# Patient Record
Sex: Female | Born: 1985 | Race: Black or African American | Hispanic: No | Marital: Married | Smoking: Former smoker
Health system: Southern US, Community
[De-identification: ages and names within clinical notes are randomized; demographics above are authoritative.]

## PROBLEM LIST (undated history)

## (undated) DIAGNOSIS — J189 Pneumonia, unspecified organism: Secondary | ICD-10-CM

## (undated) DIAGNOSIS — E611 Iron deficiency: Secondary | ICD-10-CM

## (undated) DIAGNOSIS — J40 Bronchitis, not specified as acute or chronic: Secondary | ICD-10-CM

## (undated) DIAGNOSIS — N83209 Unspecified ovarian cyst, unspecified side: Secondary | ICD-10-CM

---

## 2015-05-13 ENCOUNTER — Emergency Department: Payer: Managed Care, Other (non HMO)

## 2015-05-13 ENCOUNTER — Encounter: Payer: Self-pay | Admitting: Urgent Care

## 2015-05-13 ENCOUNTER — Emergency Department
Admission: EM | Admit: 2015-05-13 | Discharge: 2015-05-14 | Disposition: A | Discharge status: Home / Self Care | Payer: Managed Care, Other (non HMO) | Attending: Emergency Medicine | Admitting: Emergency Medicine

## 2015-05-13 DIAGNOSIS — Y998 Other external cause status: Secondary | ICD-10-CM | POA: Diagnosis not present

## 2015-05-13 DIAGNOSIS — Y9289 Other specified places as the place of occurrence of the external cause: Secondary | ICD-10-CM | POA: Insufficient documentation

## 2015-05-13 DIAGNOSIS — Y9389 Activity, other specified: Secondary | ICD-10-CM | POA: Insufficient documentation

## 2015-05-13 DIAGNOSIS — R52 Pain, unspecified: Secondary | ICD-10-CM

## 2015-05-13 DIAGNOSIS — S8991XA Unspecified injury of right lower leg, initial encounter: Secondary | ICD-10-CM | POA: Diagnosis present

## 2015-05-13 DIAGNOSIS — X58XXXA Exposure to other specified factors, initial encounter: Secondary | ICD-10-CM | POA: Insufficient documentation

## 2015-05-13 DIAGNOSIS — S73101A Unspecified sprain of right hip, initial encounter: Secondary | ICD-10-CM

## 2015-05-13 DIAGNOSIS — Z87891 Personal history of nicotine dependence: Secondary | ICD-10-CM | POA: Diagnosis not present

## 2015-05-13 NOTE — ED Notes (Signed)
Patient presents with c/o RIGHT thigh pain and RIGHT knee pain that began with acute onset last night. Massage helps, but pain returns. Denies injury.

## 2015-05-13 NOTE — ED Notes (Signed)
Pt ambulatory to room 1 without difficulty or distress noted; pt reports since yesterday having sharp pain to right upper leg from thigh to just above knee; denies hx of same; denies any known injury; +PP, skin W&D, good movem/sensation noted

## 2015-05-13 NOTE — ED Provider Notes (Signed)
[facility]  ____________________________________________  Time seen: Approximately 11:05 PM  I have reviewed the triage vital signs and the nursing notes.   HISTORY  Chief Complaint Leg Pain and Knee Pain   HPI Nancy Woods is a 29 y.o. female complaining of pain to the entire right lateral thigh.  Pt says that it started yesterday, and then got better last eve.  Pt says that this morning she it started again and has been hurting all day.  Pt denies any known injury.  Pt did recently travel to [place] and [place]. Pt denies swelling, numbness or tingling to the leg.  Pt has had no redness or warmth. Pt says that she has been walking today with only minimal pain. Pain is described as a walker on a scale of 0-10.    History reviewed. No pertinent past medical history.  There are no active problems to display for this patient.   History reviewed. No pertinent past surgical history.  Current Outpatient Rx  Name  Route  Sig  Dispense  Refill  . ibuprofen (ADVIL,MOTRIN) 800 MG tablet   Oral   Take 1 tablet (800 mg total) by mouth every 8 (eight) hours as needed.   30 tablet   0   . traMADol (ULTRAM) 50 MG tablet   Oral   Take 1 tablet (50 mg total) by mouth every 6 (six) hours as needed.   20 tablet   0     Allergies Review of patient's allergies indicates no known allergies.  No family history on file.  Social History History  Substance Use Topics  . Smoking status: Former Games developer  . Smokeless tobacco: Not on file  . Alcohol Use: Yes    Review of Systems Constitutional: No fever/chills Eyes: No visual changes. ENT: No sore throat. Cardiovascular: Denies chest pain. Respiratory: Denies shortness of breath chest pain. Gastrointestinal: No abdominal pain.  No nausea, no vomiting.  No diarrhea.  No constipation. Genitourinary: Negative for dysuria, frequency or hematuria. Musculoskeletal: Negative for back pain. Skin: Negative for  rash, redness, or calor. Neurological: Negative for headaches, focal weakness or numbness.  10-point ROS otherwise negative.  ____________________________________________   PHYSICAL EXAM:  VITAL SIGNS: ED Triage Vitals  Enc Vitals Group     BP 05/13/15 2136 105/69 mmHg     Pulse Rate 05/13/15 2136 65     Resp 05/13/15 2136 16     Temp 05/13/15 2136 98.2 F (36.8 C)     Temp Source 05/13/15 2136 Oral     SpO2 05/13/15 2136 100 %     Weight 05/13/15 2136 152 lb (68.947 kg)     Height 05/13/15 2136  (1.626 m)     Head Cir --      Peak Flow --      Pain Score 05/13/15 2137 8     Pain Loc --      Pain Edu? --      Excl. in GC? --     Constitutional: Alert and oriented. Well appearing and in no acute distress. Eyes: Conjunctivae are normal. PERRL. EOMI. Head: Atraumatic. Nose: No congestion/rhinnorhea. Mouth/Throat: Mucous membranes are moist.  Oropharynx non-erythematous. Neck: No stridor.  No cervical spine tenderness or lymphadenopathy Hematological/Lymphatic/Immunilogical: No cervical lymphadenopathy. Cardiovascular: Normal rate, regular rhythm. Grossly normal heart sounds.  Good peripheral circulation. Respiratory: Normal respiratory effort.  No retractions. Lungs CTAB. Gastrointestinal: Soft and nontender. No distention. No abdominal bruits.  Genitourinary: No CVA tenderness Musculoskeletal: Patient has  tenderness to palpation to her right lateral thigh all the way from her hip to her knee. The patient is able to fully range that time with minimal pain. There is no significant swelling noted and she has good pulses to her distal extremity..  No joint effusions. Neurologic:  Normal speech and language. No gross focal neurologic deficits are appreciated. Speech is normal. No gait instability. Skin:  Skin is warm, dry and intact. No rash noted. Psychiatric: Mood and affect are normal. Speech and behavior are normal.  ____________________________________________    LABS (all labs ordered are listed, but only abnormal results are displayed)  Labs Reviewed - No data to display ____________________________________________  EKG  None ____________________________________________  RADIOLOGY US Venous Img Lower Unilateral Right  05/14/2015   CLINICAL DATA:  Acute onset of right upper leg pain for 2 days. Initial encounter.  EXAM: RIGHT LOWER EXTREMITY VENOUS DOPPLER ULTRASOUND  TECHNIQUE: Gray-scale sonography with graded compression, as well as color Doppler and duplex ultrasound were performed to evaluate the lower extremity deep venous systems from the level of the common femoral vein and including the common femoral, femoral, profunda femoral, popliteal and calf veins including the posterior tibial, peroneal and gastrocnemius veins when visible. The superficial great saphenous vein was also interrogated. Spectral Doppler was utilized to evaluate flow at rest and with distal augmentation maneuvers in the common femoral, femoral and popliteal veins.  COMPARISON:  None.  FINDINGS: Contralateral Common Femoral Vein: Respiratory phasicity is normal and symmetric with the symptomatic side. No evidence of thrombus. Normal compressibility.  Common Femoral Vein: No evidence of thrombus. Normal compressibility, respiratory phasicity and response to augmentation.  Saphenofemoral Junction: No evidence of thrombus. Normal compressibility and flow on color Doppler imaging.  Profunda Femoral Vein: No evidence of thrombus. Normal compressibility and flow on color Doppler imaging.  Femoral Vein: No evidence of thrombus. Normal compressibility, respiratory phasicity and response to augmentation.  Popliteal Vein: No evidence of thrombus. Normal compressibility, respiratory phasicity and response to augmentation.  Calf Veins: No evidence of thrombus. Normal compressibility and flow on color Doppler imaging.  Superficial Great Saphenous Vein: No evidence of thrombus. Normal  compressibility and flow on color Doppler imaging.  Venous Reflux:  None.  Other Findings:  None.  IMPRESSION: No evidence of deep venous thrombosis.   Electronically Signed   By: Roanna Raider M.D.   On: 05/14/2015 00:51   Dg Femur, Min 2 Views Right  05/14/2015   CLINICAL DATA:  Acute onset of right thigh and knee pain. Initial encounter.  EXAM: RIGHT FEMUR 2 VIEWS  COMPARISON:  None.  FINDINGS: There is no evidence of fracture or dislocation. The right femur appears intact. The right femoral head remains seated at the acetabulum. The right knee joint is unremarkable. An intrauterine device is partially imaged. No knee joint effusion is identified. No definite soft tissue abnormalities are characterized on radiograph.  IMPRESSION: No evidence of fracture or dislocation.   Electronically Signed   By: Roanna Raider M.D.   On: 05/14/2015 00:33    ____________________________________________   PROCEDURES  Procedure(s) performed: None  Critical Care performed: No  ____________________________________________   INITIAL IMPRESSION / ASSESSMENT AND PLAN / ED COURSE  Pertinent labs & imaging results that were available during my care of the patient were reviewed by me and considered in my medical decision making (see chart for details).  ----------------------------------------- 1:28 AM on 05/14/2015 -----------------------------------------  Patient's Doppler of her leg as well as her expiratory burping or were  negative. Patient is going to be placed on tramadol and ibuprofen. Patient was told that she probably somehow sprained  her leg and just did not realize it. Patient will follow up with her PMD as needed as an outpatient. ____________________________________________   FINAL CLINICAL IMPRESSION(S) / ED DIAGNOSES  Final diagnoses:  Pain  Thigh sprain, right, initial encounter      Leona Carry, MD 05/14/15 [phone]

## 2015-05-14 MED ORDER — IBUPROFEN 800 MG PO TABS
ORAL_TABLET | ORAL | Status: AC
  Administered 2015-05-14: 800 mg via ORAL
  Filled 2015-05-14: qty 1

## 2015-05-14 MED ORDER — IBUPROFEN 800 MG PO TABS
800.0000 mg | ORAL_TABLET | Freq: Once | ORAL | Status: AC
  Administered 2015-05-14: 800 mg via ORAL

## 2015-05-14 MED ORDER — IBUPROFEN 800 MG PO TABS: 800.0000 mg | ORAL_TABLET | Freq: Three times a day (TID) | ORAL | Status: AC | PRN

## 2015-05-14 MED ORDER — TRAMADOL HCL 50 MG PO TABS
50.0000 mg | ORAL_TABLET | Freq: Once | ORAL | Status: AC
  Administered 2015-05-14: 50 mg via ORAL

## 2015-05-14 MED ORDER — TRAMADOL HCL 50 MG PO TABS
ORAL_TABLET | ORAL | Status: AC
  Administered 2015-05-14: 50 mg via ORAL
  Filled 2015-05-14: qty 1

## 2015-05-14 MED ORDER — TRAMADOL HCL 50 MG PO TABS: 50.0000 mg | ORAL_TABLET | Freq: Four times a day (QID) | ORAL | Status: AC | PRN

## 2015-08-07 ENCOUNTER — Ambulatory Visit
Admission: EM | Admit: 2015-08-07 | Discharge: 2015-08-07 | Discharge status: Absent without leave | Payer: Managed Care, Other (non HMO)

## 2015-12-27 ENCOUNTER — Emergency Department
Admission: EM | Admit: 2015-12-27 | Discharge: 2015-12-27 | Disposition: A | Discharge status: Home / Self Care | Payer: Managed Care, Other (non HMO) | Attending: Emergency Medicine | Admitting: Emergency Medicine

## 2015-12-27 ENCOUNTER — Encounter: Payer: Self-pay | Admitting: *Deleted

## 2015-12-27 ENCOUNTER — Emergency Department: Payer: Managed Care, Other (non HMO)

## 2015-12-27 DIAGNOSIS — Z3202 Encounter for pregnancy test, result negative: Secondary | ICD-10-CM | POA: Insufficient documentation

## 2015-12-27 DIAGNOSIS — R109 Unspecified abdominal pain: Secondary | ICD-10-CM

## 2015-12-27 DIAGNOSIS — R197 Diarrhea, unspecified: Secondary | ICD-10-CM | POA: Diagnosis not present

## 2015-12-27 DIAGNOSIS — R110 Nausea: Secondary | ICD-10-CM | POA: Insufficient documentation

## 2015-12-27 DIAGNOSIS — R1013 Epigastric pain: Secondary | ICD-10-CM | POA: Diagnosis not present

## 2015-12-27 DIAGNOSIS — Z87891 Personal history of nicotine dependence: Secondary | ICD-10-CM | POA: Insufficient documentation

## 2015-12-27 DIAGNOSIS — R1031 Right lower quadrant pain: Secondary | ICD-10-CM | POA: Insufficient documentation

## 2015-12-27 DIAGNOSIS — R1011 Right upper quadrant pain: Secondary | ICD-10-CM | POA: Insufficient documentation

## 2015-12-27 HISTORY — DX: Pneumonia, unspecified organism: J18.9

## 2015-12-27 HISTORY — DX: Bronchitis, not specified as acute or chronic: J40

## 2015-12-27 LAB — COMPREHENSIVE METABOLIC PANEL
ALT: 19 U/L (ref 14–54)
ANION GAP: 8 (ref 5–15)
AST: 38 U/L (ref 15–41)
Albumin: 3.9 g/dL (ref 3.5–5.0)
Alkaline Phosphatase: 52 U/L (ref 38–126)
BUN: 10 mg/dL (ref 6–20)
CHLORIDE: 102 mmol/L (ref 101–111)
CO2: 26 mmol/L (ref 22–32)
Calcium: 9.2 mg/dL (ref 8.9–10.3)
Creatinine, Ser: 0.48 mg/dL (ref 0.44–1.00)
Glucose, Bld: 80 mg/dL (ref 65–99)
Potassium: 4 mmol/L (ref 3.5–5.1)
Sodium: 136 mmol/L (ref 135–145)
TOTAL PROTEIN: 7.2 g/dL (ref 6.5–8.1)
Total Bilirubin: 0.2 mg/dL — ABNORMAL LOW (ref 0.3–1.2)

## 2015-12-27 LAB — POCT PREGNANCY, URINE: Preg Test, Ur: NEGATIVE

## 2015-12-27 LAB — CBC
HEMATOCRIT: 39.5 % (ref 35.0–47.0)
Hemoglobin: 13.2 g/dL (ref 12.0–16.0)
MCH: 30.4 pg (ref 26.0–34.0)
MCHC: 33.4 g/dL (ref 32.0–36.0)
MCV: 91.1 fL (ref 80.0–100.0)
PLATELETS: 233 10*3/uL (ref 150–440)
RBC: 4.33 MIL/uL (ref 3.80–5.20)
RDW: 13.5 % (ref 11.5–14.5)
WBC: 10.6 10*3/uL (ref 3.6–11.0)

## 2015-12-27 LAB — URINALYSIS COMPLETE WITH MICROSCOPIC (ARMC ONLY)
Bacteria, UA: NONE SEEN
Bilirubin Urine: NEGATIVE
GLUCOSE, UA: NEGATIVE mg/dL
Hgb urine dipstick: NEGATIVE
Ketones, ur: NEGATIVE mg/dL
Leukocytes, UA: NEGATIVE
NITRITE: NEGATIVE
Protein, ur: NEGATIVE mg/dL
SPECIFIC GRAVITY, URINE: 1.019 (ref 1.005–1.030)
pH: 7 (ref 5.0–8.0)

## 2015-12-27 LAB — LIPASE, BLOOD: LIPASE: 24 U/L (ref 11–51)

## 2015-12-27 MED ORDER — IOHEXOL 300 MG/ML  SOLN
100.0000 mL | Freq: Once | INTRAMUSCULAR | Status: AC | PRN
  Administered 2015-12-27: 100 mL via INTRAVENOUS

## 2015-12-27 MED ORDER — METOCLOPRAMIDE HCL 10 MG PO TABS: 10.0000 mg | ORAL_TABLET | Freq: Four times a day (QID) | ORAL | Status: AC | PRN

## 2015-12-27 MED ORDER — SODIUM CHLORIDE 0.9 % IV BOLUS (SEPSIS)
1000.0000 mL | Freq: Once | INTRAVENOUS | Status: AC
  Administered 2015-12-27: 1000 mL via INTRAVENOUS

## 2015-12-27 MED ORDER — ONDANSETRON HCL 4 MG/2ML IJ SOLN
4.0000 mg | Freq: Once | INTRAMUSCULAR | Status: AC
  Administered 2015-12-27: 4 mg via INTRAVENOUS

## 2015-12-27 MED ORDER — IOHEXOL 240 MG/ML SOLN
25.0000 mL | Freq: Once | INTRAMUSCULAR | Status: AC | PRN
  Administered 2015-12-27: 25 mL via ORAL

## 2015-12-27 NOTE — Discharge Instructions (Signed)
Take reglan as needed for nausea.   Stay hydrated.   Take tylenol, motrin for pain.   Follow up with your doctor.   Return to ER if you have worse abdominal pain, vomiting, fevers.

## 2015-12-27 NOTE — ED Notes (Signed)
Patient transported to CT 

## 2015-12-27 NOTE — ED Notes (Signed)
Pt finished  With contrast, texting on phone in no acute distress. Pt states "i feel better".

## 2015-12-27 NOTE — ED Provider Notes (Signed)
CSN: [id number]     Arrival date & time 12/27/15  1918 History   First MD Initiated Contact with Patient 12/27/15 1948     Chief Complaint  Patient presents with  . Abdominal Pain     (Consider location/radiation/quality/duration/timing/severity/associated sxs/prior Treatment) The history is provided by the patient.  Nayanna Seaborn is a 30 y.o. female hx of pneumonia here with RLQ pain, epigastric pain, nausea. Started with nausea 2 days ago and had an episode of diarrhea 2 days ago. She then had worsening epigastric pain and nausea but no diarrhea. She also has sinus congestion and runny nose and headaches but denies neck pain or fevers. She has an IUD and had some spotting since yesterday. Denies urinary symptoms. States that her pain is now more in the RLQ.    Past Medical History  Diagnosis Date  . Pneumonia   . Bronchitis    History reviewed. No pertinent past surgical history. History reviewed. No pertinent family history. Social History  Substance Use Topics  . Smoking status: Former Smoker    Types: Cigarettes  . Smokeless tobacco: None  . Alcohol Use: Yes     Comment: occasionally   OB History    No data available     Review of Systems  Gastrointestinal: Positive for nausea, abdominal pain and diarrhea.  All other systems reviewed and are negative.     Allergies  Review of patient's allergies indicates no known allergies.  Home Medications   Prior to Admission medications   Medication Sig Start Date End Date Taking? Authorizing Provider  albuterol (VENTOLIN HFA) 108 (90 Base) MCG/ACT inhaler Inhale 2 puffs into the lungs every 6 (six) hours as needed. For wheezing. 10/28/14  Yes Historical Provider, MD  ibuprofen (ADVIL,MOTRIN) 800 MG tablet Take 1 tablet (800 mg total) by mouth every 8 (eight) hours as needed. 05/14/15  Yes Leona Carry, MD  traMADol (ULTRAM) 50 MG tablet Take 50 mg by mouth every 6 (six) hours as needed. For pain. 05/14/15 05/13/16 Yes  Historical Provider, MD  traMADol (ULTRAM) 50 MG tablet Take 1 tablet (50 mg total) by mouth every 6 (six) hours as needed. 05/14/15 05/13/16  Leona Carry, MD   BP 113/80 mmHg  Pulse 87  Temp(Src) 97.5 F (36.4 C) (Oral)  Resp 20  Ht  (1.626 m)  Wt 158 lb (71.668 kg)  BMI 27.11 kg/m2  SpO2 100% Physical Exam  Constitutional: She is oriented to person, place, and time. She appears well-developed and well-nourished.  HENT:  Head: Normocephalic.  Mouth/Throat: Oropharynx is clear and moist.  Eyes: Conjunctivae are normal. Pupils are equal, round, and reactive to light.  Neck: Normal range of motion. Neck supple.  Cardiovascular: Normal rate, regular rhythm and normal heart sounds.   Pulmonary/Chest: Effort normal and breath sounds normal. No respiratory distress. She has no wheezes. She has no rales.  Abdominal: Soft. Bowel sounds are normal.  Mild diffuse tenderness, worse in RLQ and RUQ, no obvious murphy's.   Musculoskeletal: Normal range of motion.  Neurological: She is alert and oriented to person, place, and time.  Skin: Skin is warm and dry.  Psychiatric: She has a normal mood and affect. Her behavior is normal. Judgment and thought content normal.  Nursing note and vitals reviewed.   ED Course  Procedures (including critical care time) Labs Review Labs Reviewed  COMPREHENSIVE METABOLIC PANEL - Abnormal; Notable for the following:    Total Bilirubin 0.2 (*)    All other  components within normal limits  URINALYSIS COMPLETEWITH MICROSCOPIC ([facility] ONLY) - Abnormal; Notable for the following:    Color, Urine YELLOW (*)    APPearance CLEAR (*)    Squamous Epithelial / LPF 0-5 (*)    All other components within normal limits  LIPASE, BLOOD  CBC  POC URINE PREG, ED  POCT PREGNANCY, URINE    Imaging Review Ct Abdomen Pelvis W Contrast  12/27/2015  CLINICAL DATA:  Acute onset of sore throat, nausea and lower abdominal pain. Initial encounter. EXAM: CT ABDOMEN AND  PELVIS WITH CONTRAST TECHNIQUE: Multidetector CT imaging of the abdomen and pelvis was performed using the standard protocol following bolus administration of intravenous contrast. CONTRAST:  OMNIPAQUE IOHEXOL 300 MG/ML  SOLN COMPARISON:  None. FINDINGS: The visualized lung bases are clear. The liver and spleen are unremarkable in appearance. The gallbladder is within normal limits. The pancreas and adrenal glands are unremarkable. The kidneys are unremarkable in appearance. There is no evidence of hydronephrosis. No renal or ureteral stones are seen. No perinephric stranding is appreciated. The small bowel is unremarkable in appearance. The stomach is within normal limits. No acute vascular abnormalities are seen. The appendix is normal in caliber, without evidence of appendicitis. Contrast progresses to the level of the transverse colon. The colon is unremarkable in appearance. The bladder is mildly distended and grossly unremarkable. The uterus is grossly unremarkable. An intrauterine device is noted in expected position at the fundus of the uterus. Trace free fluid in the pelvis is likely physiologic in nature. The ovaries are grossly unremarkable in appearance. No suspicious adnexal masses are seen. No inguinal lymphadenopathy is seen. No acute osseous abnormalities are identified. IMPRESSION: Unremarkable contrast-enhanced CT of the abdomen and pelvis. Electronically Signed   By: Roanna Raider M.D.   On: 12/27/2015 22:39   I have personally reviewed and evaluated these images and lab results as part of my medical decision-making.   EKG Interpretation None      MDM   Final diagnoses:  None    Amzie Sillas is a 30 y.o. female here with abdominal pain. Likely gastro vs appy vs chole. Will get labs, UA, CT ab/pel. Will hydrate and reassess.   10:44 PM CT unremarkable. Labs and UA nl. Likely mild gastro. Will dc home with prn reglan.     Richardean Canal, MD 12/27/15 2245

## 2015-12-27 NOTE — ED Notes (Signed)
Pt c/o lower abdominal pain. Pt states she has had epigastric pain and nausea for several days, worsening tonight, radiating down R side of abdomen. Pt states now pain is in lower abdomen. Pt c/o nausea, denies vomiting. Pt denies dysuria at this time, c/o straining w/ BM. Pt unsure if pregnant. Pt ambulatory to triage, in no observable acute distress.

## 2016-06-21 ENCOUNTER — Ambulatory Visit
Admission: EM | Admit: 2016-06-21 | Discharge: 2016-06-21 | Disposition: A | Discharge status: Home / Self Care | Payer: Managed Care, Other (non HMO) | Attending: Family Medicine | Admitting: Family Medicine

## 2016-06-21 DIAGNOSIS — S161XXA Strain of muscle, fascia and tendon at neck level, initial encounter: Secondary | ICD-10-CM

## 2016-06-21 DIAGNOSIS — M6248 Contracture of muscle, other site: Secondary | ICD-10-CM

## 2016-06-21 DIAGNOSIS — M62838 Other muscle spasm: Secondary | ICD-10-CM

## 2016-06-21 HISTORY — DX: Unspecified ovarian cyst, unspecified side: N83.209

## 2016-06-21 HISTORY — DX: Iron deficiency: E61.1

## 2016-06-21 MED ORDER — TRAMADOL HCL 50 MG PO TABS: 50.0000 mg | ORAL_TABLET | Freq: Two times a day (BID) | ORAL | 0 refills | Status: AC | PRN

## 2016-06-21 MED ORDER — KETOROLAC TROMETHAMINE 60 MG/2ML IM SOLN
60.0000 mg | Freq: Once | INTRAMUSCULAR | Status: AC
  Administered 2016-06-21: 60 mg via INTRAMUSCULAR

## 2016-06-21 MED ORDER — MELOXICAM 15 MG PO TABS: 15.0000 mg | ORAL_TABLET | Freq: Every day | ORAL | 0 refills | Status: AC

## 2016-06-21 MED ORDER — ORPHENADRINE CITRATE ER 100 MG PO TB12: 100.0000 mg | ORAL_TABLET | Freq: Two times a day (BID) | ORAL | 0 refills | Status: AC

## 2016-06-21 NOTE — ED Triage Notes (Signed)
Patient complains of neck and back pain from packing books into a large box last night.

## 2016-06-21 NOTE — ED Provider Notes (Addendum)
[facility]    CSN: [id number] Arrival date & time: 06/21/16  1422  First Provider Contact:  First MD Initiated Contact with Patient 06/21/16 1643        History   Chief Complaint Chief Complaint  Patient presents with  . Neck Pain    HPI Nancy Woods is a 30 y.o. female.   Patient reports lifting some heavy boxes at home and start on some discomfort in neck. She continued to work but then this morning when she woke and marked pain in the right side of her neck. She denies any trauma to her neck directly. States that she some pain pain like this before and that the pain is a 9 out of 10. She denies any direct trauma to her neck. Past medical history she is not allergic to any medication   No previous medical problems other than a history of bronchitis and anemia of the cyst and one-time pneumonia but no chronic or recurrent problems. No pertinent family medical history pertaining to this visit   The history is provided by the patient. No language interpreter was used.  Neck Pain  Pain location:  R side Quality:  Aching Pain radiates to:  R shoulder Pain severity:  Moderate Pain is:  Same all the time Onset quality:  Sudden Timing:  Constant Progression:  Worsening Chronicity:  New Context: lifting a heavy object   Relieved by:  Nothing Worsened by:  Nothing Ineffective treatments:  Heat and NSAIDs Associated symptoms: no bladder incontinence, no bowel incontinence, no chest pain, no headaches, no numbness, no paresis, no visual change and no weakness   Risk factors: no hx of head and neck radiation     Past Medical History:  Diagnosis Date  . Bronchitis   . Iron deficiency   . Ovarian cyst   . Pneumonia     There are no active problems to display for this patient.   History reviewed. No pertinent surgical history.  OB History    No data available       Home Medications    Prior to Admission medications   Medication Sig Start Date End  Date Taking? Authorizing Provider  albuterol (VENTOLIN HFA) 108 (90 Base) MCG/ACT inhaler Inhale 2 puffs into the lungs every 6 (six) hours as needed. For wheezing. 10/28/14   Historical Provider, MD  ibuprofen (ADVIL,MOTRIN) 800 MG tablet Take 1 tablet (800 mg total) by mouth every 8 (eight) hours as needed. 05/14/15   Leona Carry, MD  meloxicam (MOBIC) 15 MG tablet Take 1 tablet (15 mg total) by mouth daily. 06/21/16   Hassan Rowan, MD  metoCLOPramide (REGLAN) 10 MG tablet Take 1 tablet (10 mg total) by mouth every 6 (six) hours as needed for nausea (nausea/headache). 12/27/15   Charlynne Pander, MD  orphenadrine (NORFLEX) 100 MG tablet Take 1 tablet (100 mg total) by mouth 2 (two) times daily. 06/21/16   Hassan Rowan, MD  traMADol (ULTRAM) 50 MG tablet Take 1 tablet (50 mg total) by mouth every 12 (twelve) hours as needed for moderate pain or severe pain. 06/21/16   Hassan Rowan, MD    Family History Family History  Problem Relation Age of Onset  . Diabetes Other   . Hypertension Other     Social History Social History  Substance Use Topics  . Smoking status: Former Smoker    Types: Cigarettes  . Smokeless tobacco: Never Used  . Alcohol use Yes     Comment: occasionally  Allergies   Review of patient's allergies indicates no known allergies.   Review of Systems Review of Systems  Cardiovascular: Negative for chest pain.  Gastrointestinal: Negative for bowel incontinence.  Genitourinary: Negative for bladder incontinence.  Musculoskeletal: Positive for neck pain.  Neurological: Negative for weakness, numbness and headaches.  All other systems reviewed and are negative.    Physical Exam Triage Vital Signs ED Triage Vitals  Enc Vitals Group     BP 06/21/16 1536 115/73     Pulse Rate 06/21/16 1536 65     Resp 06/21/16 1536 18     Temp 06/21/16 1536 97.7 F (36.5 C)     Temp Source 06/21/16 1536 Oral     SpO2 06/21/16 1536 100 %     Weight 06/21/16 1535 160 lb (72.6 kg)       Height 06/21/16 1535 5\' 4"  (1.626 m)     Head Circumference --      Peak Flow --      Pain Score 06/21/16 1536 9     Pain Loc --      Pain Edu? --      Excl. in GC? --    No data found.   Updated Vital Signs BP 115/73 (BP Location: Left Arm)   Pulse 65   Temp 97.7 F (36.5 C) (Oral)   Resp 18   Ht 5\' 4"  (1.626 m)   Wt 160 lb (72.6 kg)   LMP 06/18/2016   SpO2 100%   BMI 27.46 kg/m   Visual Acuity Right Eye Distance:   Left Eye Distance:   Bilateral Distance:    Right Eye Near:   Left Eye Near:    Bilateral Near:     Physical Exam  Constitutional: She is oriented to person, place, and time. She appears well-developed and well-nourished.  HENT:  Head: Normocephalic and atraumatic.  Eyes: Conjunctivae and EOM are normal. Pupils are equal, round, and reactive to light.  Neck: Trachea normal and normal range of motion. Neck supple. Muscular tenderness present. No tracheal deviation present.    Pulmonary/Chest: Effort normal.  Musculoskeletal: She exhibits tenderness.  Neurological: She is alert and oriented to person, place, and time.  Skin: Skin is warm. She is not diaphoretic.  Psychiatric: She has a normal mood and affect.  Vitals reviewed.    UC Treatments / Results  Labs (all labs ordered are listed, but only abnormal results are displayed) Labs Reviewed - No data to display  EKG  EKG Interpretation None       Radiology No results found.  Procedures Procedures (including critical care time)  Medications Ordered in UC Medications  ketorolac (TORADOL) injection 60 mg (60 mg Intramuscular Given 06/21/16 1657)     Initial Impression / Assessment and Plan / UC Course  I have reviewed the triage vital signs and the nursing notes.  Pertinent labs & imaging results that were available during my care of the patient were reviewed by me and considered in my medical decision making (see chart for details).  Clinical Course    Patient will be  given a 60 mg Toradol now for the pain she states is 9 out of 10. Since there was no direct trauma to her neck we will forego x-raying her neck at this time she seemed comfortable with that. We'll give her tramadol to use at night if need be for sleep. Warned tramadol can cause sedation and addiction. We'll place him Mobic 15 mg Norflex 100 mg twice  a day. PCP next week if needed. Offer patient a work note but she declined states is off work this week and next week  Final Clinical Impressions(s) / UC Diagnoses   Final diagnoses:  Cervical strain, acute, initial encounter  Muscle spasms of neck  Trapezius muscle spasm    New Prescriptions New Prescriptions   MELOXICAM (MOBIC) 15 MG TABLET    Take 1 tablet (15 mg total) by mouth daily.   ORPHENADRINE (NORFLEX) 100 MG TABLET    Take 1 tablet (100 mg total) by mouth 2 (two) times daily.   TRAMADOL (ULTRAM) 50 MG TABLET    Take 1 tablet (50 mg total) by mouth every 12 (twelve) hours as needed for moderate pain or severe pain.     Hassan Rowan, MD 06/21/16 1710    Hassan Rowan, MD 06/21/16 [phone]

## 2017-03-11 IMAGING — CT CT ABD-PELV W/ CM
1 of 2 series · 15 of 32 positions shown, 19 images · IV contrast (omnipaque)
Comparison: None.

CLINICAL DATA: Acute onset of sore throat, nausea and lower
abdominal pain. Initial encounter.

EXAM:
CT ABDOMEN AND PELVIS WITH CONTRAST
TECHNIQUE: Multidetector CT imaging of the abdomen and pelvis was performed
using the standard protocol following bolus administration of
intravenous contrast.
CONTRAST:  100mL OMNIPAQUE IOHEXOL 300 MG/ML  SOLN

[Series 2: routine abd pel with · axial · 0.74mm/px · z∈[-438,-42]mm · 15 of 87 slices shown, 19 images]
[im 4/87  soft-tissue]
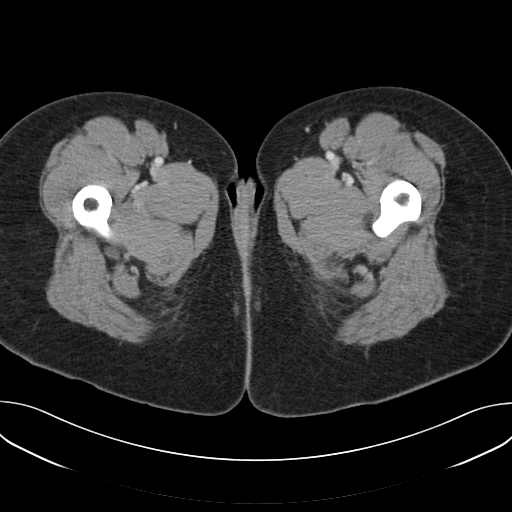
[im 4/87  bone]
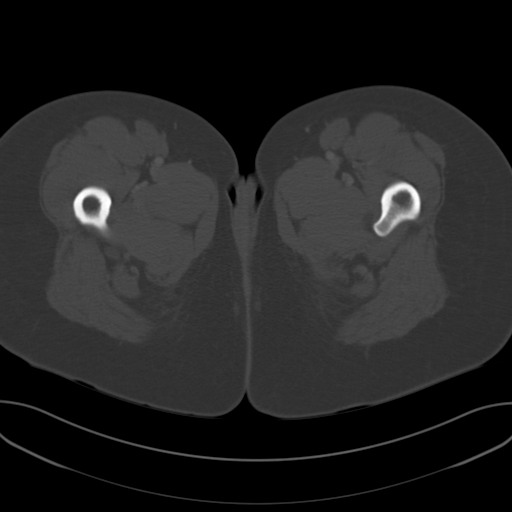
[im 10/87  soft-tissue]
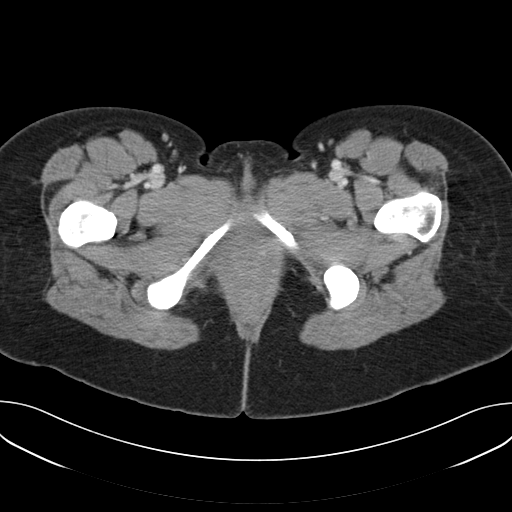
[im 17/87  soft-tissue]
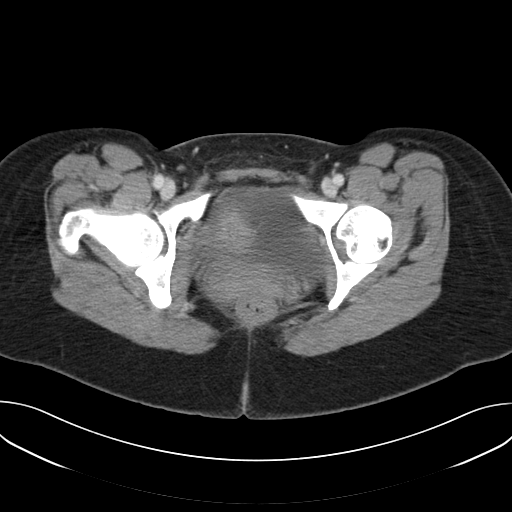
[im 24/87  soft-tissue]
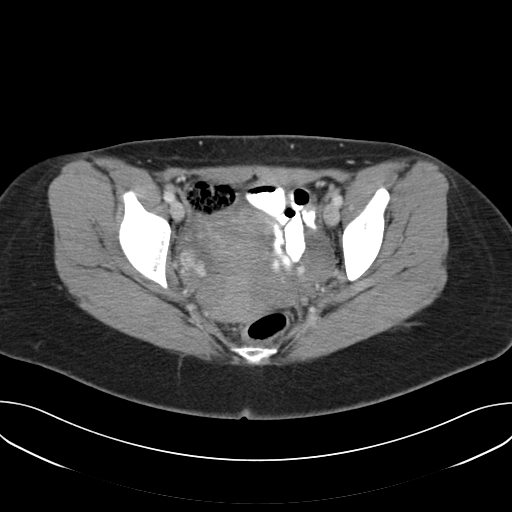
[im 30/87  soft-tissue]
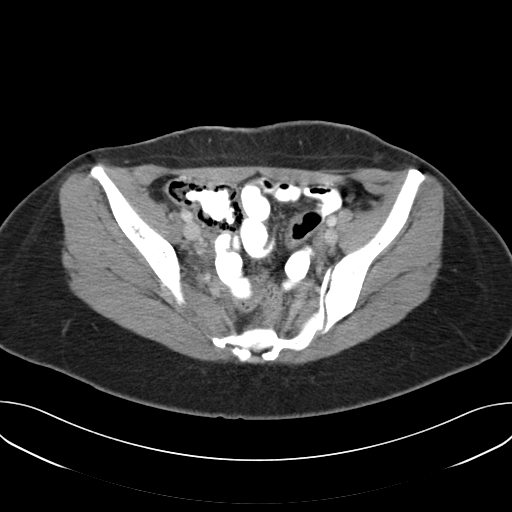
[im 37/87  soft-tissue]
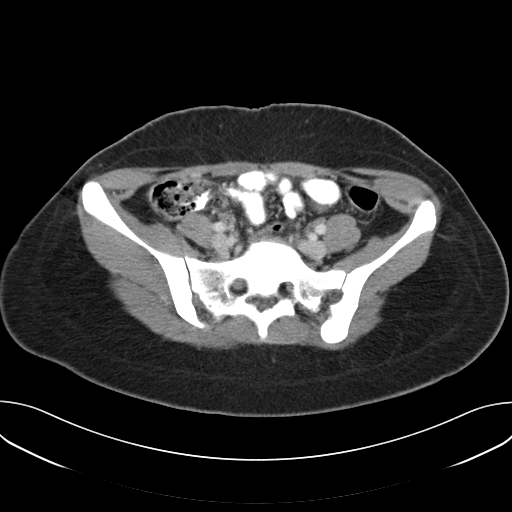
[im 44/87  soft-tissue]
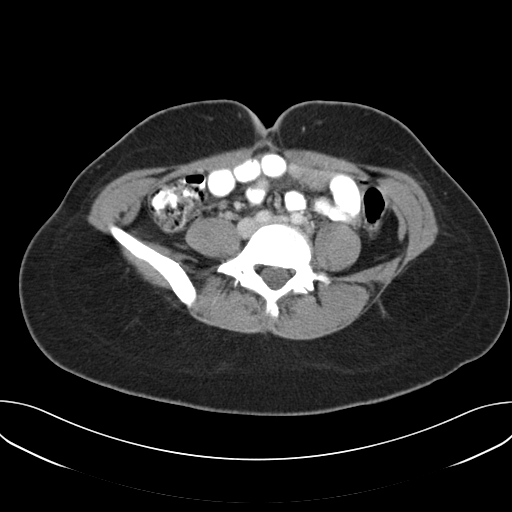
[im 50/87  soft-tissue]
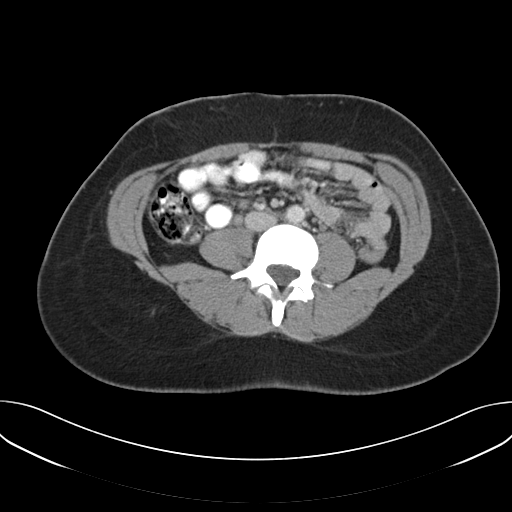
[im 57/87  soft-tissue]
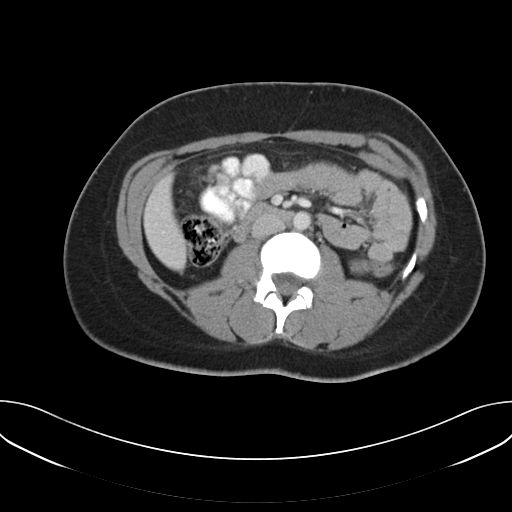
[im 57/87  bone]
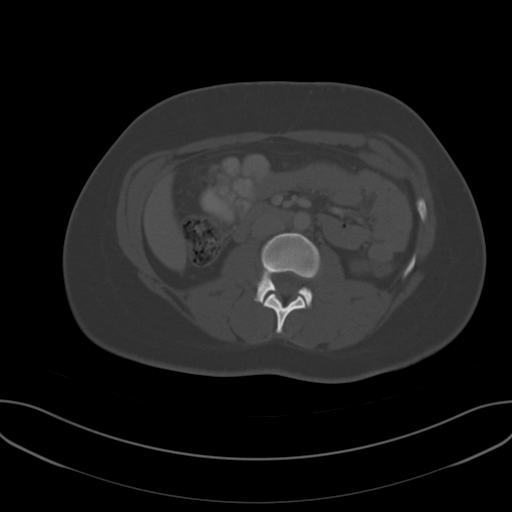
[im 63/87  soft-tissue]
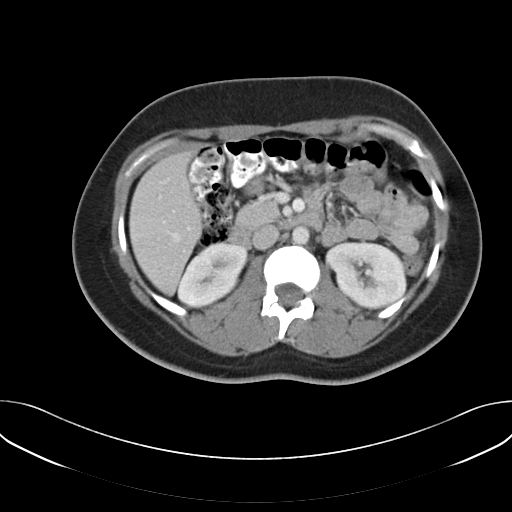
[im 70/87  soft-tissue]
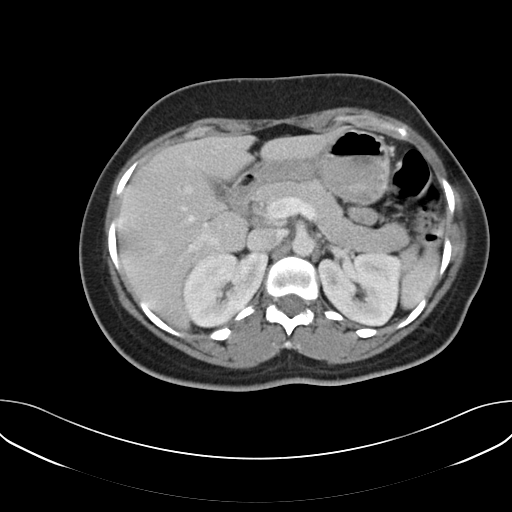
[im 73/87  lung]
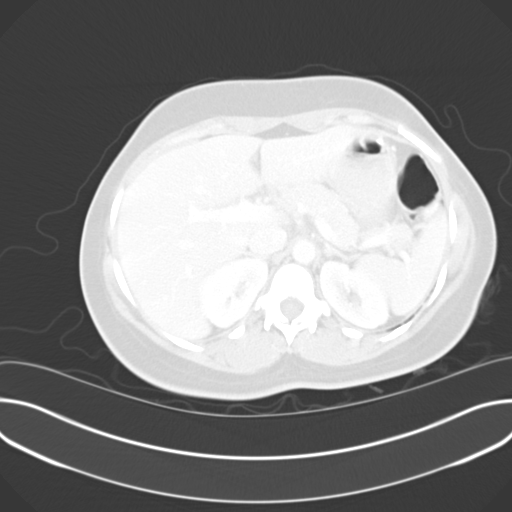
[im 77/87  soft-tissue]
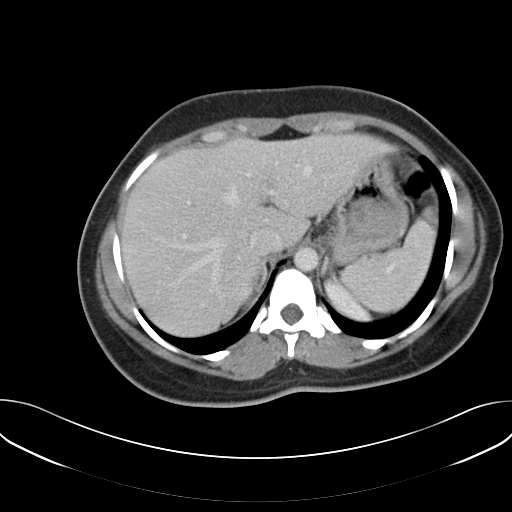
[im 77/87  lung]
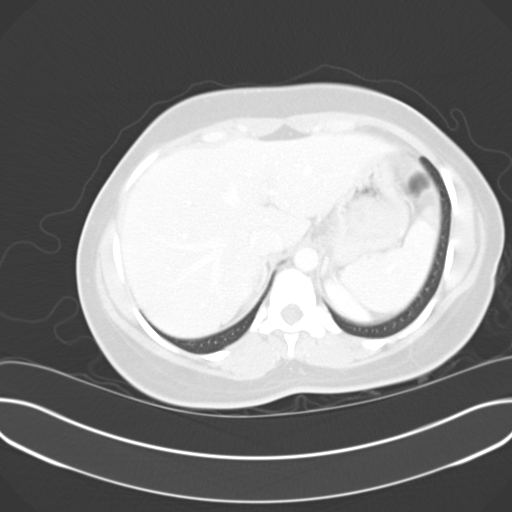
[im 80/87  lung]
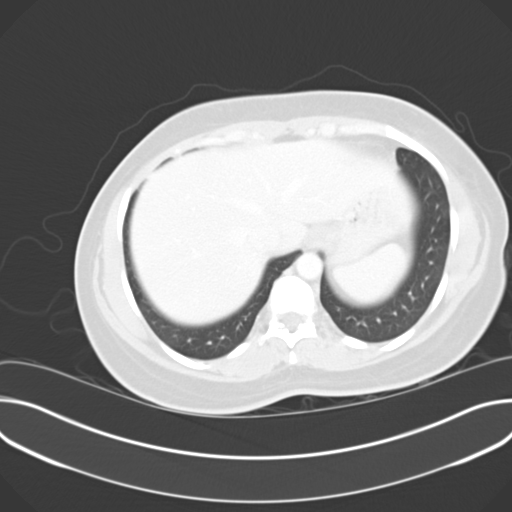
[im 83/87  soft-tissue]
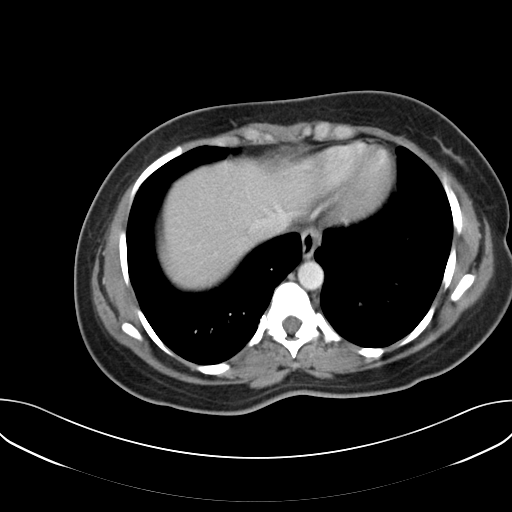
[im 83/87  lung]
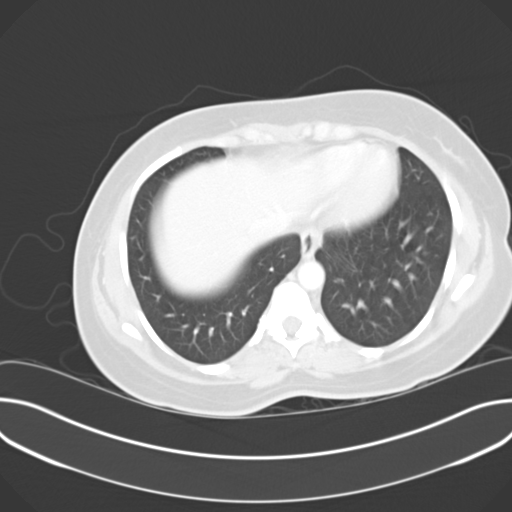

[15 of 32 positions shown; findings below may reference images not displayed]

FINDINGS: The visualized lung bases are clear.

The liver and spleen are unremarkable in appearance. The gallbladder
is within normal limits. The pancreas and adrenal glands are
unremarkable.

The kidneys are unremarkable in appearance. There is no evidence of
hydronephrosis. No renal or ureteral stones are seen. No perinephric
stranding is appreciated.

The small bowel is unremarkable in appearance. The stomach is within
normal limits. No acute vascular abnormalities are seen.

The appendix is normal in caliber, without evidence of appendicitis.
Contrast progresses to the level of the transverse colon. The colon
is unremarkable in appearance.

The bladder is mildly distended and grossly unremarkable. The uterus
is grossly unremarkable. An intrauterine device is noted in expected
position at the fundus of the uterus. Trace free fluid in the pelvis
is likely physiologic in nature. The ovaries are grossly
unremarkable in appearance. No suspicious adnexal masses are seen.
No inguinal lymphadenopathy is seen.

No acute osseous abnormalities are identified.
IMPRESSION: Unremarkable contrast-enhanced CT of the abdomen and pelvis.
